# Patient Record
Sex: Female | Born: 1995 | Hispanic: No | Marital: Married | Smoking: Never smoker
Health system: Southern US, Community
[De-identification: ages and names within clinical notes are randomized; demographics above are authoritative.]

## PROBLEM LIST (undated history)

## (undated) HISTORY — PX: NOSE SURGERY: SHX723

---

## 2020-11-06 ENCOUNTER — Emergency Department (HOSPITAL_COMMUNITY)
Admission: EM | Admit: 2020-11-06 | Discharge: 2020-11-06 | Disposition: A | Discharge status: Home / Self Care | Payer: Medicaid Other | Attending: Physician Assistant | Admitting: Physician Assistant

## 2020-11-06 ENCOUNTER — Encounter (HOSPITAL_COMMUNITY): Payer: Self-pay

## 2020-11-06 ENCOUNTER — Emergency Department (HOSPITAL_COMMUNITY): Payer: Medicaid Other

## 2020-11-06 ENCOUNTER — Other Ambulatory Visit: Payer: Self-pay

## 2020-11-06 DIAGNOSIS — H5789 Other specified disorders of eye and adnexa: Secondary | ICD-10-CM | POA: Diagnosis not present

## 2020-11-06 DIAGNOSIS — Z5321 Procedure and treatment not carried out due to patient leaving prior to being seen by health care provider: Secondary | ICD-10-CM | POA: Insufficient documentation

## 2020-11-06 DIAGNOSIS — R202 Paresthesia of skin: Secondary | ICD-10-CM | POA: Insufficient documentation

## 2020-11-06 DIAGNOSIS — M79602 Pain in left arm: Secondary | ICD-10-CM | POA: Insufficient documentation

## 2020-11-06 DIAGNOSIS — R079 Chest pain, unspecified: Secondary | ICD-10-CM | POA: Diagnosis not present

## 2020-11-06 LAB — CBC WITH DIFFERENTIAL/PLATELET
Abs Immature Granulocytes: 0 10*3/uL (ref 0.00–0.07)
Basophils Absolute: 0 10*3/uL (ref 0.0–0.1)
Basophils Relative: 1 %
Eosinophils Absolute: 0.1 10*3/uL (ref 0.0–0.5)
Eosinophils Relative: 3 %
HCT: 34.1 % — ABNORMAL LOW (ref 36.0–46.0)
Hemoglobin: 9.8 g/dL — ABNORMAL LOW (ref 12.0–15.0)
Immature Granulocytes: 0 %
Lymphocytes Relative: 63 %
Lymphs Abs: 2.3 10*3/uL (ref 0.7–4.0)
MCH: 21.9 pg — ABNORMAL LOW (ref 26.0–34.0)
MCHC: 28.7 g/dL — ABNORMAL LOW (ref 30.0–36.0)
MCV: 76.1 fL — ABNORMAL LOW (ref 80.0–100.0)
Monocytes Absolute: 0.3 10*3/uL (ref 0.1–1.0)
Monocytes Relative: 8 %
Neutro Abs: 0.9 10*3/uL — ABNORMAL LOW (ref 1.7–7.7)
Neutrophils Relative %: 25 %
Platelets: 288 10*3/uL (ref 150–400)
RBC: 4.48 MIL/uL (ref 3.87–5.11)
RDW: 16.7 % — ABNORMAL HIGH (ref 11.5–15.5)
WBC: 3.6 10*3/uL — ABNORMAL LOW (ref 4.0–10.5)
nRBC: 0 % (ref 0.0–0.2)

## 2020-11-06 LAB — COMPREHENSIVE METABOLIC PANEL
ALT: 18 U/L (ref 0–44)
AST: 22 U/L (ref 15–41)
Albumin: 4 g/dL (ref 3.5–5.0)
Alkaline Phosphatase: 38 U/L (ref 38–126)
Anion gap: 5 (ref 5–15)
BUN: 5 mg/dL — ABNORMAL LOW (ref 6–20)
CO2: 23 mmol/L (ref 22–32)
Calcium: 8.8 mg/dL — ABNORMAL LOW (ref 8.9–10.3)
Chloride: 107 mmol/L (ref 98–111)
Creatinine, Ser: 0.5 mg/dL (ref 0.44–1.00)
GFR, Estimated: >60 mL/min (ref >60)
Glucose, Bld: 96 mg/dL (ref 70–99)
Potassium: 3.6 mmol/L (ref 3.5–5.1)
Sodium: 135 mmol/L (ref 135–145)
Total Bilirubin: 0.5 mg/dL (ref 0.3–1.2)
Total Protein: 7.2 g/dL (ref 6.5–8.1)

## 2020-11-06 LAB — I-STAT BETA HCG BLOOD, ED (MC, WL, AP ONLY): I-stat hCG, quantitative: <5 m[IU]/mL (ref <5)

## 2020-11-06 LAB — CK: Total CK: 75 U/L (ref 38–234)

## 2020-11-06 LAB — TROPONIN I (HIGH SENSITIVITY): Troponin I (High Sensitivity): <2 ng/L (ref <18)
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
## 2020-11-06 NOTE — ED Notes (Signed)
The patient LWBS. The patient was informed to seek medical attention if her symptoms worsen

## 2020-11-06 NOTE — ED Notes (Signed)
EKG procedure explained to patient. Patient has requested a female staff member obtain EKG for cultural reasons. Triage nurse made aware.
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
## 2021-03-13 ENCOUNTER — Other Ambulatory Visit: Payer: Self-pay

## 2021-03-13 ENCOUNTER — Encounter (HOSPITAL_COMMUNITY): Payer: Self-pay

## 2021-03-13 ENCOUNTER — Emergency Department (HOSPITAL_COMMUNITY)
Admission: EM | Admit: 2021-03-13 | Discharge: 2021-03-13 | Disposition: A | Discharge status: Home / Self Care | Payer: Medicaid Other | Attending: Emergency Medicine | Admitting: Emergency Medicine

## 2021-03-13 ENCOUNTER — Emergency Department (HOSPITAL_COMMUNITY): Payer: Medicaid Other

## 2021-03-13 DIAGNOSIS — R519 Headache, unspecified: Secondary | ICD-10-CM | POA: Diagnosis present

## 2021-03-13 DIAGNOSIS — Z20822 Contact with and (suspected) exposure to covid-19: Secondary | ICD-10-CM | POA: Insufficient documentation

## 2021-03-13 DIAGNOSIS — J0140 Acute pansinusitis, unspecified: Secondary | ICD-10-CM | POA: Insufficient documentation

## 2021-03-13 DIAGNOSIS — N9489 Other specified conditions associated with female genital organs and menstrual cycle: Secondary | ICD-10-CM | POA: Insufficient documentation

## 2021-03-13 LAB — CBC WITH DIFFERENTIAL/PLATELET
Abs Immature Granulocytes: 0.01 10*3/uL (ref 0.00–0.07)
Basophils Absolute: 0 10*3/uL (ref 0.0–0.1)
Basophils Relative: 0 %
Eosinophils Absolute: 0 10*3/uL (ref 0.0–0.5)
Eosinophils Relative: 0 %
HCT: 33.2 % — ABNORMAL LOW (ref 36.0–46.0)
Hemoglobin: 9.9 g/dL — ABNORMAL LOW (ref 12.0–15.0)
Immature Granulocytes: 0 %
Lymphocytes Relative: 31 %
Lymphs Abs: 1.5 10*3/uL (ref 0.7–4.0)
MCH: 22.5 pg — ABNORMAL LOW (ref 26.0–34.0)
MCHC: 29.8 g/dL — ABNORMAL LOW (ref 30.0–36.0)
MCV: 75.5 fL — ABNORMAL LOW (ref 80.0–100.0)
Monocytes Absolute: 0.4 10*3/uL (ref 0.1–1.0)
Monocytes Relative: 8 %
Neutro Abs: 3 10*3/uL (ref 1.7–7.7)
Neutrophils Relative %: 61 %
Platelets: 329 10*3/uL (ref 150–400)
RBC: 4.4 MIL/uL (ref 3.87–5.11)
RDW: 16.2 % — ABNORMAL HIGH (ref 11.5–15.5)
WBC: 5 10*3/uL (ref 4.0–10.5)
nRBC: 0 % (ref 0.0–0.2)

## 2021-03-13 LAB — BASIC METABOLIC PANEL
Anion gap: 7 (ref 5–15)
BUN: 6 mg/dL (ref 6–20)
CO2: 24 mmol/L (ref 22–32)
Calcium: 9.4 mg/dL (ref 8.9–10.3)
Chloride: 105 mmol/L (ref 98–111)
Creatinine, Ser: 0.45 mg/dL (ref 0.44–1.00)
GFR, Estimated: >60 mL/min (ref >60)
Glucose, Bld: 96 mg/dL (ref 70–99)
Potassium: 3.7 mmol/L (ref 3.5–5.1)
Sodium: 136 mmol/L (ref 135–145)

## 2021-03-13 LAB — RESP PANEL BY RT-PCR (FLU A&B, COVID) ARPGX2
Influenza A by PCR: NEGATIVE
Influenza B by PCR: NEGATIVE
SARS Coronavirus 2 by RT PCR: NEGATIVE

## 2021-03-13 LAB — I-STAT BETA HCG BLOOD, ED (MC, WL, AP ONLY): I-stat hCG, quantitative: <5 m[IU]/mL (ref <5)

## 2021-03-13 MED ORDER — AMOXICILLIN-POT CLAVULANATE 875-125 MG PO TABS: 1.0000 | ORAL_TABLET | Freq: Two times a day (BID) | ORAL | 0 refills | Status: AC

## 2021-03-13 MED ORDER — AMOXICILLIN-POT CLAVULANATE 875-125 MG PO TABS
1.0000 | ORAL_TABLET | Freq: Once | ORAL | Status: AC
  Administered 2021-03-13: 1 via ORAL
  Filled 2021-03-13: qty 1

## 2021-03-13 MED ORDER — KETOROLAC TROMETHAMINE 30 MG/ML IJ SOLN
30.0000 mg | Freq: Once | INTRAMUSCULAR | Status: AC
  Administered 2021-03-13: 30 mg via INTRAVENOUS
  Filled 2021-03-13: qty 1

## 2021-03-13 MED ORDER — METOCLOPRAMIDE HCL 5 MG/ML IJ SOLN
5.0000 mg | Freq: Once | INTRAMUSCULAR | Status: AC
  Administered 2021-03-13: 5 mg via INTRAVENOUS
  Filled 2021-03-13: qty 2

## 2021-03-13 MED ORDER — FLUTICASONE PROPIONATE 50 MCG/ACT NA SUSP
2.0000 | Freq: Every day | NASAL | Status: DC
  Administered 2021-03-13: 2 via NASAL
  Filled 2021-03-13: qty 16

## 2021-03-13 MED ORDER — FLUTICASONE PROPIONATE 50 MCG/ACT NA SUSP: 1.0000 | Freq: Every day | NASAL | 0 refills | Status: AC

## 2021-03-13 MED ORDER — DEXAMETHASONE SODIUM PHOSPHATE 4 MG/ML IJ SOLN
4.0000 mg | Freq: Once | INTRAMUSCULAR | Status: AC
  Administered 2021-03-13: 4 mg via INTRAVENOUS
  Filled 2021-03-13: qty 1

## 2021-03-13 NOTE — Discharge Instructions (Signed)
You have severe sinus infection that we saw on your CT scan.  You should use the Flonase, 1 spray in each nose, at bedtime for the next 7 days.  He will also need to start taking antibiotics beginning tomorrow for 10 days.

## 2021-03-13 NOTE — ED Provider Notes (Signed)
Emergency Medicine Provider Triage Evaluation Note  Anne Blake , a 25 y.o. female  was evaluated in triage.  Pt complains of headache since yesterday.  She states that she feels like someone is hitting her over the back of her head.  She has had similar headaches before but states this 1 is much worse.  She reports her headache worsens with any movement.  She does report some nasal congestion.  No fevers.  No syncope.  No cough.  No trauma. Review of Systems  Positive: Headache, congestion Negative: Fevers, syncope  Physical Exam  BP 100/62 (BP Location: Left Arm)    Pulse 83    Temp 98.4 F (36.9 C)    Resp 18    Ht 5\' 1"  (1.549 m)    Wt 43 kg    SpO2 100%    BMI 17.91 kg/m  Gen:   Awake, no distress   Resp:  Normal effort  MSK:   Moves extremities without difficulty  Other:  Normal speech.  Normal gait.  Moves all 4 extremities spontaneously without difficulty.  Speech is not slurred.  No word finding difficulties.  S Medical Decision Making  Medically screening exam initiated at 4:33 PM.  Appropriate orders placed.  Anne Blake was informed that the remainder of the evaluation will be completed by another provider, this initial triage assessment does not replace that evaluation, and the importance of remaining in the ED until their evaluation is complete.  Based on the degree of her reported headache will check labs and CT scan.   , Cristina Gong 03/13/21 2154    2155, MD 03/13/21 2340

## 2021-03-13 NOTE — ED Provider Notes (Signed)
[facility] EMERGENCY DEPARTMENT Provider Note   CSN: [id number] Arrival date & time: 03/13/21  1453     History Chief Complaint  Patient presents with   Headache    Anne Blake is a 25 y.o. female presented emergency department a frontal headache.  Patient ports onset of symptoms yesterday.  She reports she had some congestion several days prior to that.  She has a severe, throbbing frontal headache, worse with movement, also reports some blurred vision associated with this.  She says it is similar to the sinus headache she had in the past.  She said that she would get sinus headaches and [place], and this resolves her headaches and congestion.  She does not follow with an ENT doctor here.  She is now living in the area.  She reports that her sinus headaches in the past would also cause "blurred vision" in her eyes.    HPI     History reviewed. No pertinent past medical history.  There are no problems to display for this patient.   Past Surgical History:  Procedure Laterality Date   NOSE SURGERY       OB History   No obstetric history on file.     History reviewed. No pertinent family history.  Social History   Tobacco Use   Smoking status: Never   Smokeless tobacco: Never  Substance Use Topics   Alcohol use: Never   Drug use: Never    Home Medications Prior to Admission medications   Medication Sig Start Date End Date Taking? Authorizing Provider  amoxicillin-clavulanate (AUGMENTIN) 875-125 MG tablet Take 1 tablet by mouth every 12 (twelve) hours for 10 days. 03/14/21 03/24/21 Yes Khadijah Mastrianni, Kermit Balo, MD  fluticasone (FLONASE) 50 MCG/ACT nasal spray Place 1 spray into both nostrils daily for 7 days. 03/13/21 03/20/21 Yes Vallorie Niccoli, Kermit Balo, MD    Allergies    Peanut-containing drug products  Review of Systems   Review of Systems  Constitutional:  Negative for chills and fever.   HENT:  Negative for ear pain and sore throat.   Eyes:  Positive for visual disturbance. Negative for photophobia and pain.  Respiratory:  Negative for cough and shortness of breath.   Cardiovascular:  Negative for chest pain and palpitations.  Gastrointestinal:  Negative for abdominal pain and vomiting.  Genitourinary:  Negative for dysuria and hematuria.  Musculoskeletal:  Negative for arthralgias and myalgias.  Skin:  Negative for color change and rash.  Neurological:  Positive for dizziness, light-headedness and headaches. Negative for syncope and speech difficulty.  All other systems reviewed and are negative.  Physical Exam Updated Vital Signs BP (!) 104/56 (BP Location: Left Arm)    Pulse 95    Temp 98.3 F (36.8 C) (Oral)    Resp 17    Ht 5\' 1"  (1.549 m)    Wt 43 kg    SpO2 100%    BMI 17.91 kg/m   Physical Exam Constitutional:      General: She is not in acute distress. HENT:     Head: Normocephalic and atraumatic.  Eyes:     General: Lids are normal.     Extraocular Movements: Extraocular movements intact.     Right eye: No nystagmus.     Left eye: No nystagmus.     Conjunctiva/sclera: Conjunctivae normal.     Right eye: Right conjunctiva is not injected. No chemosis or exudate.  Left eye: Left conjunctiva is not injected. No chemosis or exudate.    Pupils: Pupils are equal, round, and reactive to light.  Cardiovascular:     Rate and Rhythm: Normal rate and regular rhythm.  Pulmonary:     Effort: Pulmonary effort is normal. No respiratory distress.  Abdominal:     General: There is no distension.     Tenderness: There is no abdominal tenderness.  Skin:    General: Skin is warm and dry.  Neurological:     General: No focal deficit present.     Mental Status: She is alert. Mental status is at baseline.  Psychiatric:        Mood and Affect: Mood normal.        Behavior: Behavior normal.    ED Results / Procedures / Treatments   Labs (all labs ordered are  listed, but only abnormal results are displayed) Labs Reviewed  CBC WITH DIFFERENTIAL/PLATELET - Abnormal; Notable for the following components:      Result Value   Hemoglobin 9.9 (*)    HCT 33.2 (*)    MCV 75.5 (*)    MCH 22.5 (*)    MCHC 29.8 (*)    RDW 16.2 (*)    All other components within normal limits  RESP PANEL BY RT-PCR (FLU A&B, COVID) ARPGX2  BASIC METABOLIC PANEL  I-STAT BETA HCG BLOOD, ED ([facility], [facility], [facility] ONLY)    EKG None  Radiology CT HEAD WO CONTRAST (5MM)  Result Date: 03/13/2021 CLINICAL DATA:  Severe headache EXAM: CT HEAD WITHOUT CONTRAST TECHNIQUE: Contiguous axial images were obtained from the base of the skull through the vertex without intravenous contrast. COMPARISON:  None. FINDINGS: Brain: No acute intracranial abnormality. Specifically, no hemorrhage, hydrocephalus, mass lesion, acute infarction, or significant intracranial injury. Vascular: No hyperdense vessel or unexpected calcification. Skull: No acute calvarial abnormality. Sinuses/Orbits: Extensive disease throughout the paranasal sinuses with near complete opacification of all visualized paranasal sinuses. Air-fluid level seen in the left sphenoid sinus and scattered ethmoid air cells. Mastoid air cells clear. Other: None IMPRESSION: No acute intracranial abnormality. Pansinusitis Electronically Signed   By: Rolm Baptise M.D.   On: 03/13/2021 17:07    Procedures Procedures   Medications Ordered in ED Medications  fluticasone (FLONASE) 50 MCG/ACT nasal spray 2 spray (2 sprays Each Nare Given 03/13/21 2132)  ketorolac (TORADOL) 30 MG/ML injection 30 mg (30 mg Intravenous Given 03/13/21 2135)  metoCLOPramide (REGLAN) injection 5 mg (5 mg Intravenous Given 03/13/21 2135)  dexamethasone (DECADRON) injection 4 mg (4 mg Intravenous Given 03/13/21 2135)  amoxicillin-clavulanate (AUGMENTIN) 875-125 MG per tablet 1 tablet (1 tablet Oral Given 03/13/21 2134)    ED Course  I have reviewed the triage vital  signs and the nursing notes.  Pertinent labs & imaging results that were available during my care of the patient were reviewed by me and considered in my medical decision making (see chart for details).  Patient is here with a frontal headache, CT scan reviewed showing significant congestion of all sinuses.  I strongly suspect this is a sinusitis headache.  She does not appear septic.  White blood cell count is normal.  She does not show signs of meningitis.  She does not have photophobia or nuchal rigidity.  I likewise doubt that this is related to a stroke or CVT or brain infection.   She had similar blurred vision on her prior episodes of sinus headaches, no actual visual deficits.  I would recommend initiating Augmentin  and Flonase as a decongestant and as an antibiotic, and referral to ENT.  We will also give her some pain medications through an IV here.    Final Clinical Impression(s) / ED Diagnoses Final diagnoses:  Acute pansinusitis, recurrence not specified    Rx / DC Orders ED Discharge Orders          Ordered    amoxicillin-clavulanate (AUGMENTIN) 875-125 MG tablet  Every 12 hours        03/13/21 2206    fluticasone (FLONASE) 50 MCG/ACT nasal spray  Daily        03/13/21 2206             Wyvonnia Dusky, MD 03/13/21 2342

## 2021-03-13 NOTE — ED Triage Notes (Signed)
Pt arrives POV for eval of severe HA, reports it feels as though someone is "hitting her over the back of her head" States onset yesterday, worse w/ movement. Endorses blurry vision No weakness, drift, droop in triage. Grossly neuro intact aside from blurry vision

## 2021-09-02 ENCOUNTER — Emergency Department (HOSPITAL_COMMUNITY): Payer: Medicaid Other

## 2021-09-02 ENCOUNTER — Other Ambulatory Visit: Payer: Self-pay

## 2021-09-02 ENCOUNTER — Encounter (HOSPITAL_COMMUNITY): Payer: Self-pay | Admitting: Emergency Medicine

## 2021-09-02 ENCOUNTER — Emergency Department (HOSPITAL_COMMUNITY)
Admission: EM | Admit: 2021-09-02 | Discharge: 2021-09-02 | Disposition: A | Discharge status: Home / Self Care | Payer: Medicaid Other | Attending: Emergency Medicine | Admitting: Emergency Medicine

## 2021-09-02 DIAGNOSIS — M25532 Pain in left wrist: Secondary | ICD-10-CM | POA: Diagnosis not present

## 2021-09-02 DIAGNOSIS — M79602 Pain in left arm: Secondary | ICD-10-CM

## 2021-09-02 DIAGNOSIS — W109XXA Fall (on) (from) unspecified stairs and steps, initial encounter: Secondary | ICD-10-CM | POA: Diagnosis not present

## 2021-09-02 DIAGNOSIS — Y92009 Unspecified place in unspecified non-institutional (private) residence as the place of occurrence of the external cause: Secondary | ICD-10-CM | POA: Diagnosis not present

## 2021-09-02 MED ORDER — IBUPROFEN 600 MG PO TABS: 600.0000 mg | ORAL_TABLET | Freq: Four times a day (QID) | ORAL | 0 refills | Status: DC | PRN

## 2021-09-02 MED ORDER — IBUPROFEN 600 MG PO TABS
600.0000 mg | ORAL_TABLET | Freq: Four times a day (QID) | ORAL | 0 refills | Status: AC | PRN
Start: 2021-09-02 — End: ?

## 2021-09-02 NOTE — ED Provider Notes (Signed)
[facility] EMERGENCY DEPARTMENT Provider Note   CSN: [id number] Arrival date & time: 09/02/21  1846     History  Chief Complaint  Patient presents with   Wrist Injury    Anne Blake is a 26 y.o. female.  HPI  26 year old female presents emergency department with complaints of left arm pain.  She states that she slipped going down the steps this evening and landed on her arm.  Complains of pain.  Denies sensory deficits or weakness.  Denies loss of consciousness or head trauma.  Patient able to ambulate without difficulty.  Denies fever, chills, night sweats, chest pain, shortness of breath abdominal pain, nausea/vomiting/diarrhea, urinary/vaginal symptoms.  History reviewed. No pertinent past medical history.  Past Surgical History:  Procedure Laterality Date   NOSE SURGERY       Home Medications Prior to Admission medications   Medication Sig Start Date End Date Taking? Authorizing Provider  fluticasone (FLONASE) 50 MCG/ACT nasal spray Place 1 spray into both nostrils daily for 7 days. 03/13/21 03/20/21  Terald Sleeper, MD      Allergies    Peanut-containing drug products    Review of Systems   Review of Systems  Constitutional:  Negative for chills and fever.  HENT:  Negative for ear pain and sore throat.   Eyes:  Negative for pain and visual disturbance.  Respiratory:  Negative for cough and shortness of breath.   Cardiovascular:  Negative for chest pain and palpitations.  Gastrointestinal:  Negative for abdominal pain and vomiting.  Genitourinary:  Negative for dysuria and hematuria.  Musculoskeletal:  Negative for arthralgias and back pain.       Left arm pain  Skin:  Negative for color change and rash.  Neurological:  Negative for seizures and syncope.  All other systems reviewed and are negative.  Physical Exam Updated Vital Signs BP 117/68 (BP Location: Right Arm)   Pulse 88   Temp 99.4 F (37.4 C) (Oral)   Resp 14    SpO2 100%  Physical Exam Vitals and nursing note reviewed.  Constitutional:      General: She is not in acute distress.    Appearance: She is well-developed.  HENT:     Head: Normocephalic and atraumatic.  Eyes:     Conjunctiva/sclera: Conjunctivae normal.  Cardiovascular:     Rate and Rhythm: Normal rate and regular rhythm.     Heart sounds: No murmur heard. Pulmonary:     Effort: Pulmonary effort is normal. No respiratory distress.     Breath sounds: Normal breath sounds.  Abdominal:     Palpations: Abdomen is soft.     Tenderness: There is no abdominal tenderness.  Musculoskeletal:     Cervical back: Neck supple.     Comments: Pain is exacerbated with palpation of distal left radius and ulna.  No overlying skin abnormality noted.  Full range of motion of elbow, wrist, digits of affected extremity with no pain.  No overlying skin abnormalities noted.  Radial pulses full and intact bilaterally.  No sensory deficits along major nerve distributions of upper or lower extremities.  Muscle strength 5 out of 5 for upper extremities.  Patient able to make okay sign, fist, thumbs up, extend wrist, resist horizontal adduction of digits.  Skin:    General: Skin is warm and dry.     Capillary Refill: Capillary refill takes less than 2 seconds.  Neurological:     Mental Status: She is alert.  Psychiatric:  Mood and Affect: Mood normal.    ED Results / Procedures / Treatments   Labs (all labs ordered are listed, but only abnormal results are displayed) Labs Reviewed - No data to display  EKG None  Radiology DG Forearm Left  Result Date: 09/02/2021 CLINICAL DATA:  Pain.  Fell on stairs.  Medial left wrist pain. EXAM: LEFT FOREARM - 2 VIEW COMPARISON:  None Available. FINDINGS: Normal bone mineralization. The elbow and wrist joint spaces are maintained. Normal alignment. No acute fracture is seen. No dislocation. No elbow joint effusion. IMPRESSION: Normal left forearm radiographs.  Electronically Signed   By: Neita Garnet M.D.   On: 09/02/2021 19:44    Procedures Procedures    Medications Ordered in ED Medications - No data to display  ED Course/ Medical Decision Making/ A&P                           Medical Decision Making Amount and/or Complexity of Data Reviewed Radiology: ordered.   This patient presents to the ED for concern of arm pain, this involves an extensive number of treatment options, and is a complaint that carries with it a high risk of complications and morbidity.  The differential diagnosis includes wrist strain/sprain, fracture, compartment syndrome, neurovascular injury   Imaging Studies ordered:  I ordered imaging studies including left forearm I independently visualized and interpreted imaging which showed no acute abnormality I agree with the radiologist interpretation   Cardiac Monitoring: / EKG:  The patient was maintained on a cardiac monitor.  I personally viewed and interpreted the cardiac monitored which showed an underlying rhythm of: Sinus rhythm   Consultations Obtained:  N/a   Problem List / ED Course / Critical interventions / Medication management  Left arm pain Reevaluation of the patient showed that the patient stayed the same I have reviewed the patients home medicines and have made adjustments as needed   Social Determinants of Health:  Denies tobacco, illicit drug use   Test / Admission - Considered:  Left arm pain Vitals signs within normal range and stable throughout visit. Imaging studies significant for: No acute abnormality Symptoms likely related to left arm strain/sprain.  Symptomatic therapy with rest, ice, elevation, compression, NSAID recommended.  Ortho follow-up recommended in 7 to 10 days if symptoms persist or if they worsen. Worrisome signs and symptoms were discussed with the patient, and the patient acknowledged understanding to return to the ED if noticed. Patient was stable upon  discharge.           Final Clinical Impression(s) / ED Diagnoses Final diagnoses:  Left arm pain    Rx / DC Orders ED Discharge Orders     None         Peter Garter, [place] 09/02/21 2206    Ernie Avena, MD 09/02/21 2218

## 2021-09-02 NOTE — Discharge Instructions (Addendum)
Take ibuprofen every 4-6 hours as directed.  I would take this for the next 5 to 7 days each engage response.  I also provided a wrist brace to use to help avoid future injury.  I also provided orthopedic information to follow-up in 7 to 10 days if symptoms do not get better or they get worse.  Please do not hesitate to return to the emergency department for worrisome signs and symptoms we discussed become apparent.

## 2021-09-02 NOTE — ED Triage Notes (Signed)
Patient missed her step and fell while going down the stairs at home this evening , denies LOC/ambulatory , reports mild pain at left wrist and left hand . No deformity/skin intact .

## 2022-03-16 IMAGING — CT CT HEAD W/O CM
4 series · 16 of 47 positions shown, 18 images · non-contrast
Comparison: None.

CLINICAL DATA: Severe headache

EXAM:
CT HEAD WITHOUT CONTRAST
TECHNIQUE: Contiguous axial images were obtained from the base of the skull
through the vertex without intravenous contrast.

[Series 3: head without · axial · non-contrast · 0.39mm/px · z∈[-58,+52]mm · 7 of 30 slices shown, 9 images]
[im 4/30  brain]
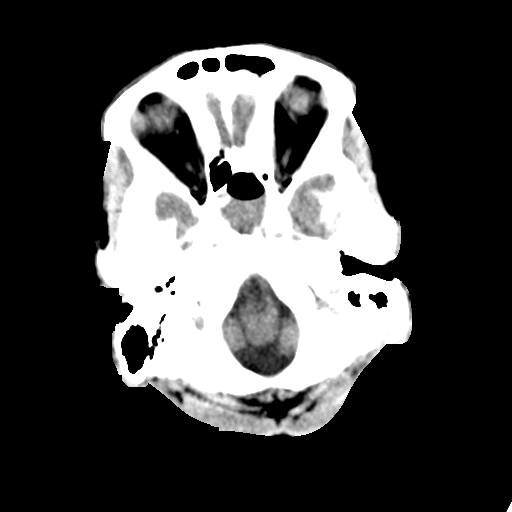
[im 4/30  bone]
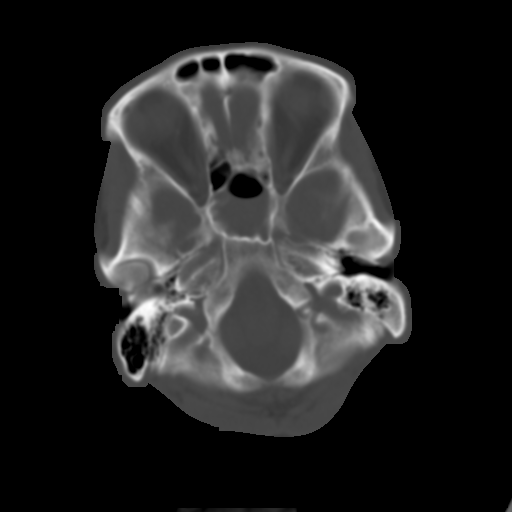
[im 8/30  brain]
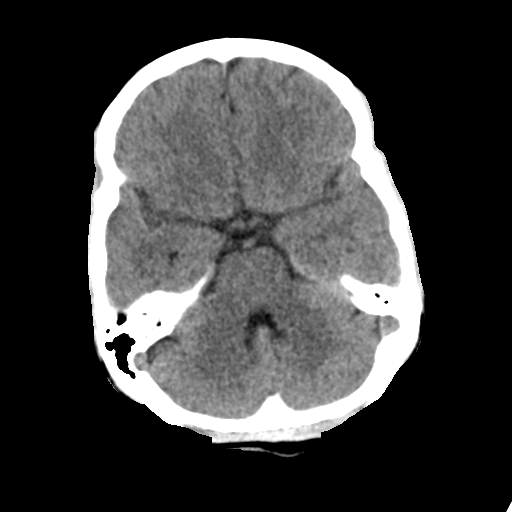
[im 11/30  brain]
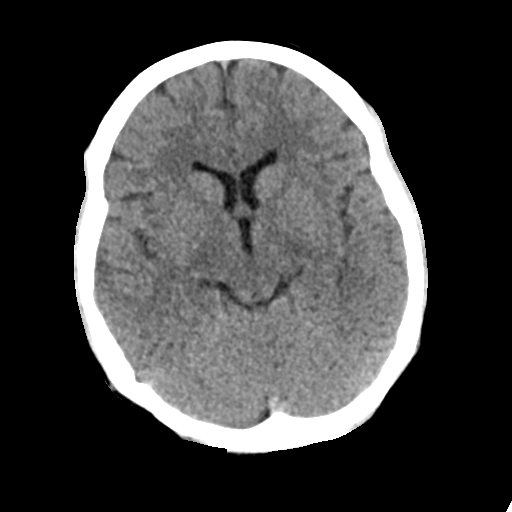
[im 15/30  brain]
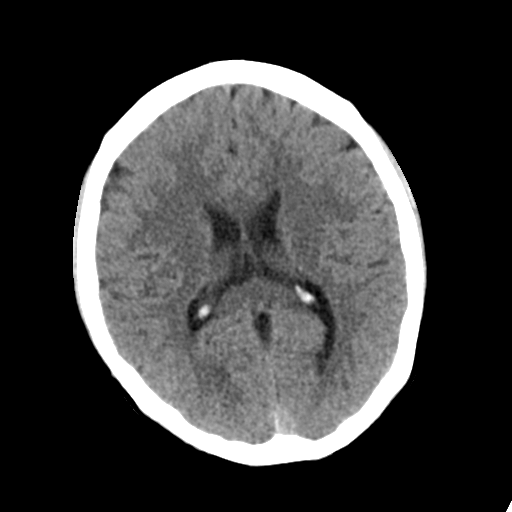
[im 19/30  brain]
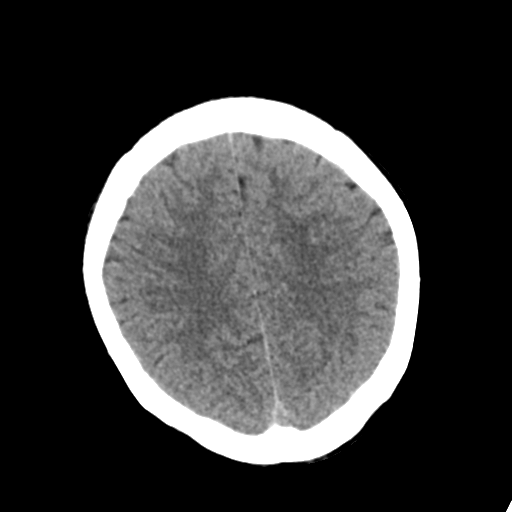
[im 19/30  bone]
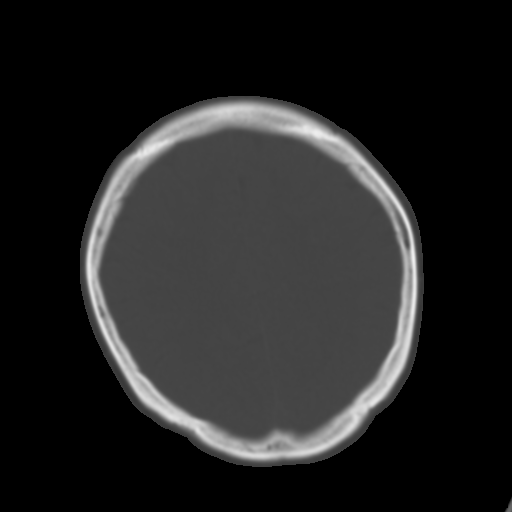
[im 22/30  brain]
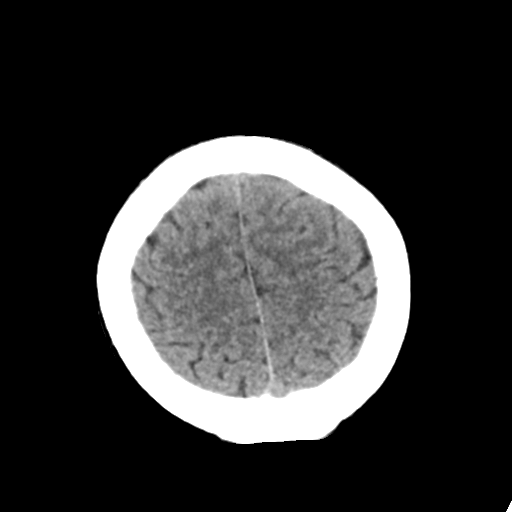
[im 26/30  brain]
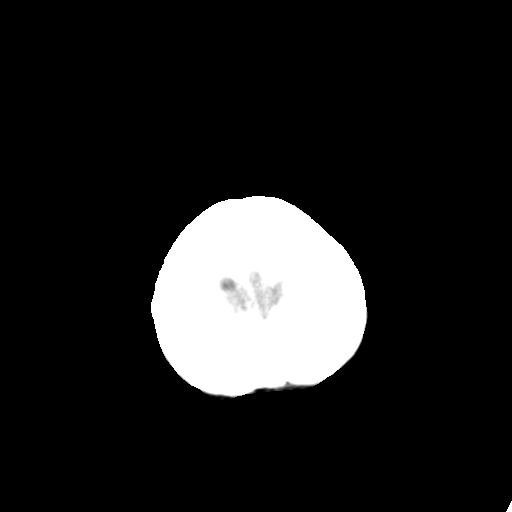

[Series 4: head bone · axial · 0.39mm/px · z∈[-59,-31]mm · 3 of 74 slices shown]
[im 8/74  bone]
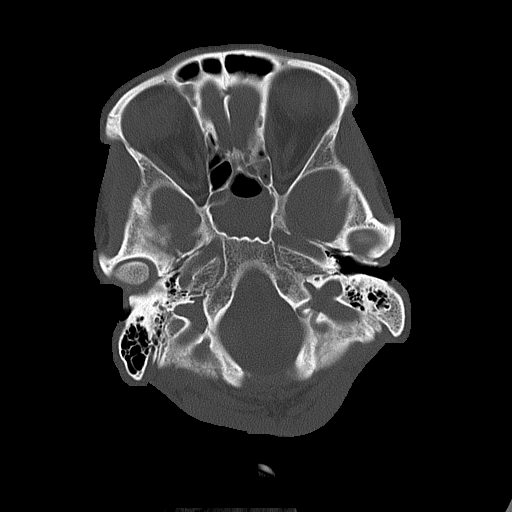
[im 15/74  bone]
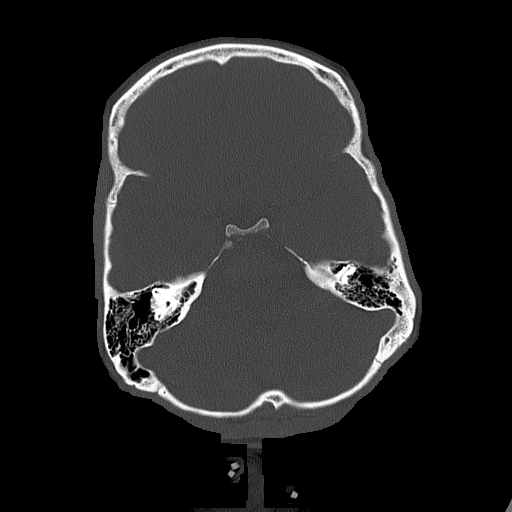
[im 22/74  bone]
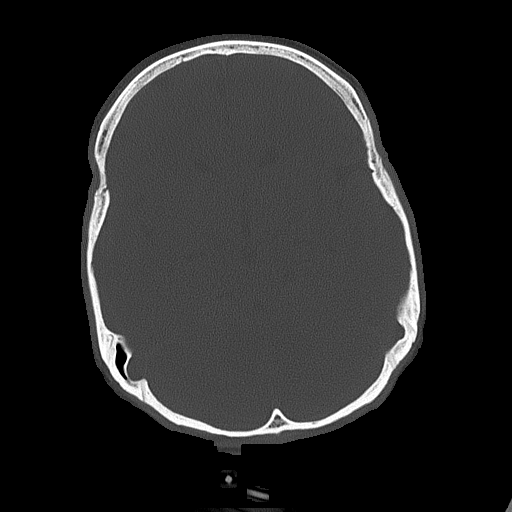

[Series 5: head without cor · coronal · non-contrast · 0.29mm/px · 3 of 67 slices shown]
[im 24/67  brain]
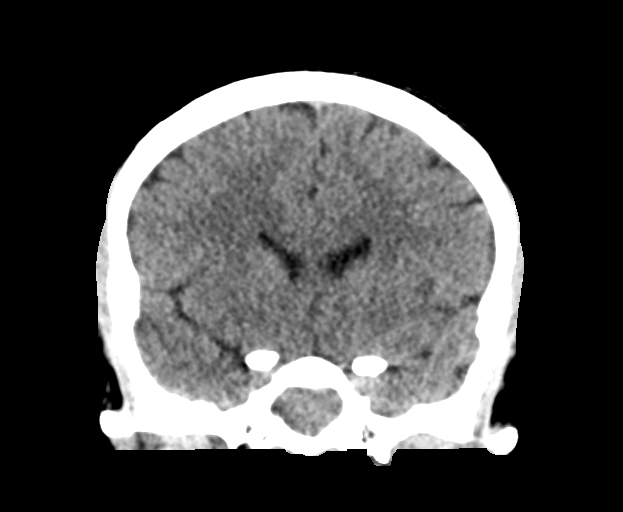
[im 30/67  brain]
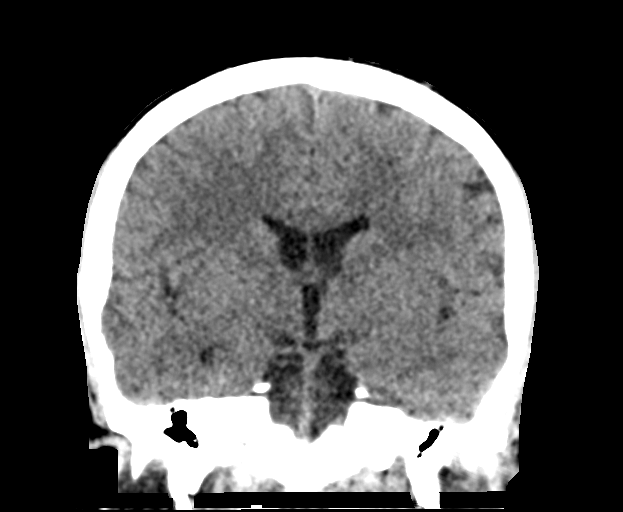
[im 37/67  brain]
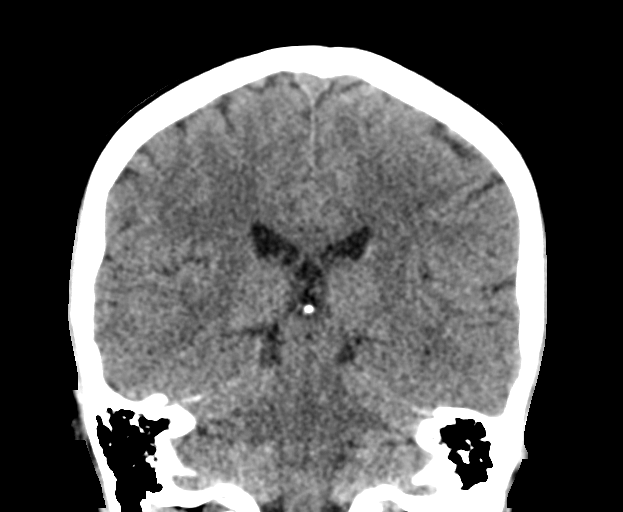

[Series 6: head without sag · sagittal · non-contrast · 0.29mm/px · 3 of 56 slices shown]
[im 19/56  brain]
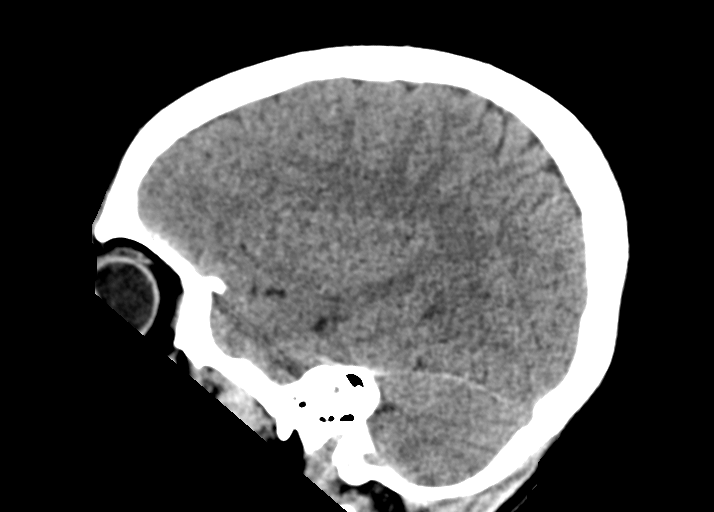
[im 28/56  brain]
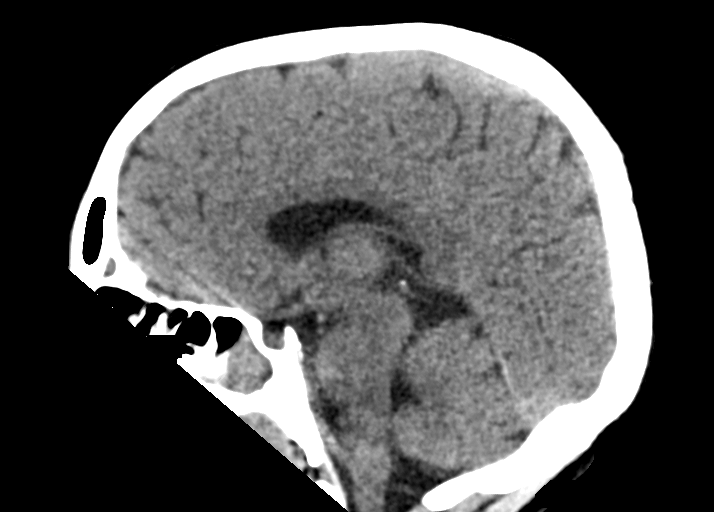
[im 37/56  brain]
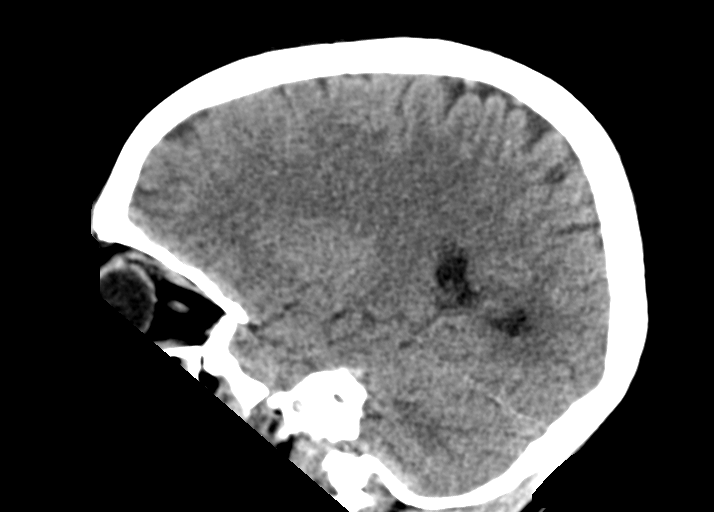

[16 of 47 positions shown; findings below may reference images not displayed]

FINDINGS: Brain: No acute intracranial abnormality. Specifically, no
hemorrhage, hydrocephalus, mass lesion, acute infarction, or
significant intracranial injury.

Vascular: No hyperdense vessel or unexpected calcification.

Skull: No acute calvarial abnormality.

Sinuses/Orbits: Extensive disease throughout the paranasal sinuses
with near complete opacification of all visualized paranasal
sinuses. Air-fluid level seen in the left sphenoid sinus and
scattered ethmoid air cells. Mastoid air cells clear.

Other: None
IMPRESSION: No acute intracranial abnormality.

Pansinusitis

## 2022-09-05 IMAGING — DX DG FOREARM 2V*L*
2 series · 2 of 2 positions shown · non-contrast
Comparison: None Available.

CLINICAL DATA: Pain.  Fell on stairs.  Medial left wrist pain.

EXAM:
LEFT FOREARM - 2 VIEW

[x forearm ap left]
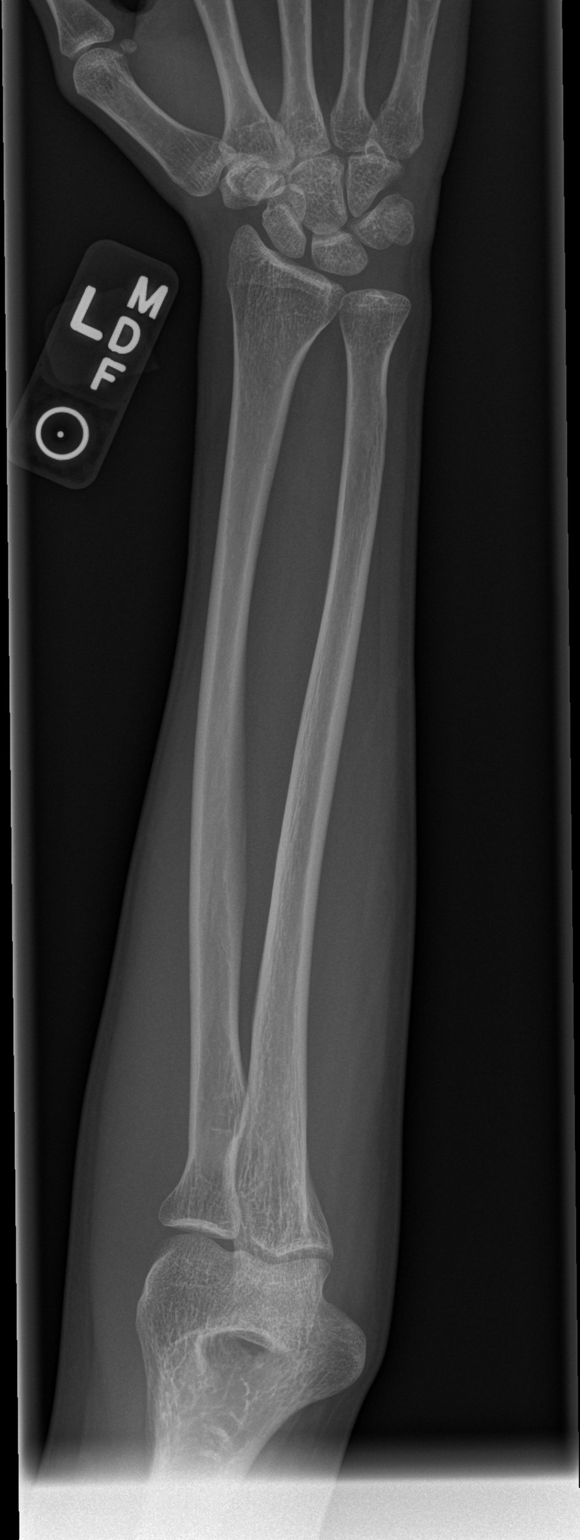

[x forearm lat left]
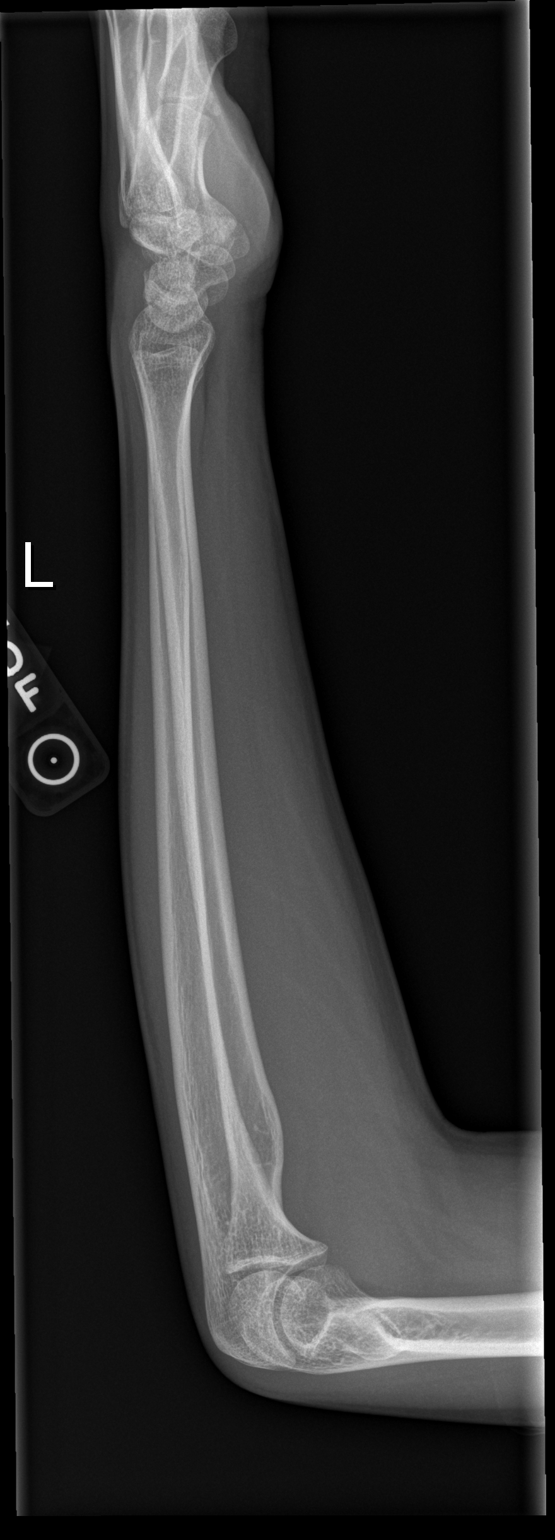

[2 of 2 positions shown; findings below may reference images not displayed]

FINDINGS: Normal bone mineralization. The elbow and wrist joint spaces are
maintained. Normal alignment. No acute fracture is seen. No
dislocation. No elbow joint effusion.
IMPRESSION: Normal left forearm radiographs.
